# Patient Record
Sex: Male | Born: 1950 | Race: White | Hispanic: No | Marital: Married | Smoking: Never smoker
Health system: Southern US, Community
[De-identification: ages and names within clinical notes are randomized; demographics above are authoritative.]

## PROBLEM LIST (undated history)

## (undated) DIAGNOSIS — R972 Elevated prostate specific antigen [PSA]: Secondary | ICD-10-CM

## (undated) DIAGNOSIS — M1990 Unspecified osteoarthritis, unspecified site: Secondary | ICD-10-CM

## (undated) DIAGNOSIS — B029 Zoster without complications: Secondary | ICD-10-CM

## (undated) DIAGNOSIS — B019 Varicella without complication: Secondary | ICD-10-CM

## (undated) HISTORY — PX: COLONOSCOPY W/ POLYPECTOMY: SHX1380

## (undated) HISTORY — PX: NO PAST SURGERIES: SHX2092

## (undated) HISTORY — DX: Elevated prostate specific antigen (PSA): R97.20

## (undated) SURGERY — Surgical Case
Anesthesia: *Unknown

---

## 2005-04-19 ENCOUNTER — Ambulatory Visit: Payer: Self-pay | Admitting: Internal Medicine

## 2013-10-04 ENCOUNTER — Ambulatory Visit: Payer: Self-pay | Admitting: Internal Medicine

## 2015-10-27 ENCOUNTER — Encounter: Payer: Self-pay | Admitting: *Deleted

## 2015-10-28 ENCOUNTER — Encounter
Admission: RE | Disposition: A | Discharge status: Home / Self Care | Payer: Self-pay | Source: Ambulatory Visit | Attending: Unknown Physician Specialty

## 2015-10-28 ENCOUNTER — Ambulatory Visit: Payer: Medicare HMO | Admitting: Anesthesiology

## 2015-10-28 ENCOUNTER — Encounter: Payer: Self-pay | Admitting: Anesthesiology

## 2015-10-28 ENCOUNTER — Ambulatory Visit
Admission: RE | Admit: 2015-10-28 | Discharge: 2015-10-28 | Disposition: A | Discharge status: Home / Self Care | Payer: Medicare HMO | Source: Ambulatory Visit | Attending: Unknown Physician Specialty | Admitting: Unknown Physician Specialty

## 2015-10-28 DIAGNOSIS — Z8619 Personal history of other infectious and parasitic diseases: Secondary | ICD-10-CM | POA: Insufficient documentation

## 2015-10-28 DIAGNOSIS — Z7982 Long term (current) use of aspirin: Secondary | ICD-10-CM | POA: Diagnosis not present

## 2015-10-28 DIAGNOSIS — K640 First degree hemorrhoids: Secondary | ICD-10-CM | POA: Diagnosis not present

## 2015-10-28 DIAGNOSIS — Z79899 Other long term (current) drug therapy: Secondary | ICD-10-CM | POA: Diagnosis not present

## 2015-10-28 DIAGNOSIS — Z800 Family history of malignant neoplasm of digestive organs: Secondary | ICD-10-CM | POA: Diagnosis present

## 2015-10-28 DIAGNOSIS — M1990 Unspecified osteoarthritis, unspecified site: Secondary | ICD-10-CM | POA: Insufficient documentation

## 2015-10-28 DIAGNOSIS — K633 Ulcer of intestine: Secondary | ICD-10-CM | POA: Diagnosis not present

## 2015-10-28 DIAGNOSIS — Z9889 Other specified postprocedural states: Secondary | ICD-10-CM | POA: Insufficient documentation

## 2015-10-28 DIAGNOSIS — Z1211 Encounter for screening for malignant neoplasm of colon: Secondary | ICD-10-CM | POA: Diagnosis present

## 2015-10-28 DIAGNOSIS — D123 Benign neoplasm of transverse colon: Secondary | ICD-10-CM | POA: Insufficient documentation

## 2015-10-28 DIAGNOSIS — K5730 Diverticulosis of large intestine without perforation or abscess without bleeding: Secondary | ICD-10-CM | POA: Insufficient documentation

## 2015-10-28 HISTORY — DX: Varicella without complication: B01.9

## 2015-10-28 HISTORY — DX: Unspecified osteoarthritis, unspecified site: M19.90

## 2015-10-28 HISTORY — DX: Zoster without complications: B02.9

## 2015-10-28 HISTORY — PX: COLONOSCOPY WITH PROPOFOL: SHX5780

## 2015-10-28 SURGERY — COLONOSCOPY WITH PROPOFOL
Anesthesia: General

## 2015-10-28 MED ORDER — SODIUM CHLORIDE 0.9 % IV SOLN
INTRAVENOUS | Status: DC
  Administered 2015-10-28: 1000 mL via INTRAVENOUS

## 2015-10-28 MED ORDER — GLYCOPYRROLATE 0.2 MG/ML IJ SOLN
INTRAMUSCULAR | Status: DC | PRN
Start: 2015-10-28 — End: 2015-10-28
  Administered 2015-10-28: 0.2 mg via INTRAVENOUS

## 2015-10-28 MED ORDER — SODIUM CHLORIDE 0.9 % IV SOLN: INTRAVENOUS | Status: DC

## 2015-10-28 MED ORDER — PROPOFOL 10 MG/ML IV BOLUS
INTRAVENOUS | Status: DC | PRN
  Administered 2015-10-28: 20 mg via INTRAVENOUS
  Administered 2015-10-28: 60 mg via INTRAVENOUS
  Administered 2015-10-28: 20 mg via INTRAVENOUS

## 2015-10-28 MED ORDER — PROPOFOL 500 MG/50ML IV EMUL
INTRAVENOUS | Status: DC | PRN
  Administered 2015-10-28: 130 ug/kg/min via INTRAVENOUS

## 2015-10-28 MED ORDER — LIDOCAINE HCL (CARDIAC) 20 MG/ML IV SOLN
INTRAVENOUS | Status: DC | PRN
  Administered 2015-10-28: 60 mg via INTRAVENOUS

## 2015-10-28 MED ORDER — PHENYLEPHRINE HCL 10 MG/ML IJ SOLN
INTRAMUSCULAR | Status: DC | PRN
  Administered 2015-10-28 (×2): 50 ug via INTRAVENOUS

## 2015-10-28 NOTE — Anesthesia Postprocedure Evaluation (Signed)
Anesthesia Post Note  Patient: Benjamin Newton  Procedure(s) Performed: Procedure(s) (LRB): COLONOSCOPY WITH PROPOFOL (N/A)  Patient location during evaluation: Endoscopy Anesthesia Type: General Level of consciousness: awake and alert Pain management: pain level controlled Vital Signs Assessment: post-procedure vital signs reviewed and stable Respiratory status: spontaneous breathing, nonlabored ventilation, respiratory function stable and patient connected to nasal cannula oxygen Cardiovascular status: blood pressure returned to baseline and stable Postop Assessment: no signs of nausea or vomiting Anesthetic complications: no    Last Vitals:  Filed Vitals:   10/28/15 0949 10/28/15 0959  BP: 99/73 113/73  Pulse: 61 53  Temp:    Resp: 15 16    Last Pain: There were no vitals filed for this visit.               Precious Haws Piscitello

## 2015-10-28 NOTE — H&P (Signed)
   Primary Care Physician:  [place] Pink, MD Primary Gastroenterologist:  Dr. Vira Agar  Pre-Procedure History & Physical: HPI:  Benjamin Newton is a 65 y.o. male is here for an colonoscopy.   Past Medical History  Diagnosis Date  . Arthritis   . Chicken pox   . Shingles     Past Surgical History  Procedure Laterality Date  . Colonoscopy w/ polypectomy    . No past surgeries      Prior to Admission medications   Medication Sig Start Date End Date Taking? Authorizing Provider  aspirin 81 MG tablet Take 81 mg by mouth daily.   Yes Historical Provider, MD  etodolac (LODINE) 400 MG tablet Take 400 mg by mouth 2 (two) times daily.   Yes Historical Provider, MD  multivitamin-lutein (OCUVITE-LUTEIN) CAPS capsule Take 1 capsule by mouth daily.   Yes Historical Provider, MD  Omega-3 Fatty Acids (EQL OMEGA 3 FISH OIL) 1200 MG CAPS Take by mouth.   Yes Historical Provider, MD    Allergies as of 10/13/2015  . (Not on File)    History reviewed. No pertinent family history.  Social History   Social History  . Marital Status: Married    Spouse Name: N/A  . Number of Children: N/A  . Years of Education: N/A   Occupational History  . Not on file.   Social History Main Topics  . Smoking status: Never Smoker   . Smokeless tobacco: Never Used  . Alcohol Use: Yes  . Drug Use: No  . Sexual Activity: Not on file   Other Topics Concern  . Not on file   Social History Narrative    Review of Systems: See HPI, otherwise negative ROS  Physical Exam: BP 119/71 mmHg  Pulse 63  Temp(Src) 97.1 F (36.2 C) (Tympanic)  Resp 16  Ht 5\' 9"  (1.753 m)  Wt 76.204 kg (168 lb)  BMI 24.80 kg/m2  SpO2 100% General:   Alert,  pleasant and cooperative in NAD Head:  Normocephalic and atraumatic. Neck:  Supple; no masses or thyromegaly. Lungs:  Clear throughout to auscultation.    Heart:  Regular rate and rhythm. Abdomen:  Soft, nontender and nondistended. Normal bowel sounds, without  guarding, and without rebound.   Neurologic:  Alert and  oriented x4;  grossly normal neurologically.  Impression/Plan: Benjamin Newton is here for an colonoscopy to be performed for [facility]  Risks, benefits, limitations, and alternatives regarding  colonoscopy have been reviewed with the patient.  Questions have been answered.  All parties agreeable.   Gaylyn Cheers, MD  10/28/2015, 9:02 AM

## 2015-10-28 NOTE — Op Note (Signed)
[name] Procedure Date: 10/28/2015 9:05 AM MRN: [id number] Account #: [id number] Date of Birth: 17-Jul-1950 Admit Type: Outpatient Age: 65 Room: [facility] ENDO ROOM 4 Gender: Male Note Status: Finalized Procedure:            Colonoscopy Indications:          Screening in patient at increased risk: Family history                        of 1st-degree relative with colorectal cancer Providers:            Manya Silvas, MD Referring MD:         Irven Easterly. Kary Kos, MD (Referring MD) Medicines:            Propofol per Anesthesia Complications:        No immediate complications. Procedure:            Pre-Anesthesia Assessment:                       - After reviewing the risks and benefits, the patient                        was deemed in satisfactory condition to undergo the                        procedure.                       After obtaining informed consent, the colonoscope was                        passed under direct vision. Throughout the procedure,                        the patient's blood pressure, pulse, and oxygen                        saturations were monitored continuously. The                        Colonoscope was introduced through the anus and                        advanced to the the cecum, identified by appendiceal                        orifice and ileocecal valve. The colonoscopy was                        performed without difficulty. The patient tolerated the                        procedure well. The quality of the bowel preparation                        was excellent. Findings:      A diminutive polyp was found in the hepatic flexure. The polyp was       sessile. The polyp was removed with a jumbo cold forceps. Resection and       retrieval were complete.  A single (solitary) fourteen mm ulcer was found at the ileocecal valve.       No bleeding was present. No stigmata of recent bleeding  were seen.       Biopsies were taken with a cold forceps for histology.      Internal hemorrhoids were found during endoscopy. The hemorrhoids were       small and Grade I (internal hemorrhoids that do not prolapse).      Multiple small-mouthed diverticula were found in the sigmoid colon.      The exam was otherwise without abnormality. Impression:           - One diminutive polyp at the hepatic flexure, removed                        with a jumbo cold forceps. Resected and retrieved.                       - A single (solitary) ulcer at the ileocecal valve.                        Biopsied.                       - Internal hemorrhoids.                       - Diverticulosis in the sigmoid colon.                       - The examination was otherwise normal. Recommendation:       - Await pathology results. Manya Silvas, MD 10/28/2015 9:27:21 AM This report has been signed electronically. Number of Addenda: 0 Note Initiated On: 10/28/2015 9:05 AM Scope Withdrawal Time: 0 hours 10 minutes 26 seconds  Total Procedure Duration: 0 hours 15 minutes 54 seconds       [facility]

## 2015-10-28 NOTE — Transfer of Care (Signed)
Immediate Anesthesia Transfer of Care Note  Patient: Benjamin Newton  Procedure(s) Performed: Procedure(s): COLONOSCOPY WITH PROPOFOL (N/A)  Patient Location: PACU  Anesthesia Type:General  Level of Consciousness: awake, alert  and oriented  Airway & Oxygen Therapy: Patient Spontanous Breathing and Patient connected to nasal cannula oxygen  Post-op Assessment: Report given to RN and Post -op Vital signs reviewed and stable  Post vital signs: Reviewed and stable  Last Vitals:  Filed Vitals:   10/28/15 0844  BP: 119/71  Pulse: 63  Temp: 36.2 C  Resp: 16    Last Pain: There were no vitals filed for this visit.       Complications: No apparent anesthesia complications

## 2015-10-28 NOTE — Anesthesia Preprocedure Evaluation (Signed)
Anesthesia Evaluation  Patient identified by MRN, date of birth, ID band Patient awake    Reviewed: Allergy & Precautions, H&P , NPO status , Patient's Chart, lab work & pertinent test results  History of Anesthesia Complications Negative for: history of anesthetic complications  Airway Mallampati: III  TM Distance: <3 FB Neck ROM: limited    Dental  (+) Poor Dentition, Chipped   Pulmonary neg pulmonary ROS, neg shortness of breath,    Pulmonary exam normal breath sounds clear to auscultation       Cardiovascular Exercise Tolerance: Good (-) angina(-) DOE negative cardio ROS Normal cardiovascular exam Rhythm:regular Rate:Normal     Neuro/Psych negative neurological ROS  negative psych ROS   GI/Hepatic negative GI ROS, Neg liver ROS, neg GERD  ,  Endo/Other  negative endocrine ROS  Renal/GU negative Renal ROS  negative genitourinary   Musculoskeletal   Abdominal   Peds  Hematology negative hematology ROS (+)   Anesthesia Other Findings Past Medical History:   Arthritis                                                    Chicken pox                                                  Shingles                                                    Past Surgical History:   COLONOSCOPY W/ POLYPECTOMY                                    NO PAST SURGERIES                                             Signs and symptoms suggestive of sleep apnea    Reproductive/Obstetrics negative OB ROS                             Anesthesia Physical Anesthesia Plan  ASA: III  Anesthesia Plan: General   Post-op Pain Management:    Induction:   Airway Management Planned:   Additional Equipment:   Intra-op Plan:   Post-operative Plan:   Informed Consent: I have reviewed the patients History and Physical, chart, labs and discussed the procedure including the risks, benefits and alternatives for the  proposed anesthesia with the patient or authorized representative who has indicated his/her understanding and acceptance.   Dental Advisory Given  Plan Discussed with: Anesthesiologist, CRNA and Surgeon  Anesthesia Plan Comments:         Anesthesia Quick Evaluation

## 2015-10-29 LAB — SURGICAL PATHOLOGY

## 2015-10-30 ENCOUNTER — Encounter: Payer: Self-pay | Admitting: Unknown Physician Specialty

## 2020-03-18 ENCOUNTER — Other Ambulatory Visit: Payer: Self-pay | Admitting: Orthopedic Surgery

## 2020-03-18 DIAGNOSIS — M5441 Lumbago with sciatica, right side: Secondary | ICD-10-CM

## 2020-03-18 DIAGNOSIS — M5442 Lumbago with sciatica, left side: Secondary | ICD-10-CM

## 2020-03-18 DIAGNOSIS — M47816 Spondylosis without myelopathy or radiculopathy, lumbar region: Secondary | ICD-10-CM

## 2020-03-18 DIAGNOSIS — M4807 Spinal stenosis, lumbosacral region: Secondary | ICD-10-CM

## 2020-03-31 ENCOUNTER — Ambulatory Visit: Payer: Medicare HMO

## 2020-03-31 ENCOUNTER — Other Ambulatory Visit: Payer: Self-pay

## 2020-03-31 ENCOUNTER — Ambulatory Visit
Admission: RE | Admit: 2020-03-31 | Discharge: 2020-03-31 | Disposition: A | Discharge status: Home / Self Care | Payer: Medicare HMO | Source: Ambulatory Visit | Attending: Orthopedic Surgery | Admitting: Orthopedic Surgery

## 2020-03-31 DIAGNOSIS — M47816 Spondylosis without myelopathy or radiculopathy, lumbar region: Secondary | ICD-10-CM | POA: Diagnosis present

## 2020-03-31 DIAGNOSIS — M4807 Spinal stenosis, lumbosacral region: Secondary | ICD-10-CM

## 2020-03-31 DIAGNOSIS — M5442 Lumbago with sciatica, left side: Secondary | ICD-10-CM | POA: Diagnosis present

## 2020-03-31 DIAGNOSIS — M5441 Lumbago with sciatica, right side: Secondary | ICD-10-CM

## 2021-05-20 ENCOUNTER — Other Ambulatory Visit: Payer: Self-pay

## 2021-05-20 ENCOUNTER — Ambulatory Visit: Payer: Medicare HMO | Admitting: Dermatology

## 2021-05-20 DIAGNOSIS — L820 Inflamed seborrheic keratosis: Secondary | ICD-10-CM | POA: Diagnosis not present

## 2021-05-20 DIAGNOSIS — D229 Melanocytic nevi, unspecified: Secondary | ICD-10-CM

## 2021-05-20 DIAGNOSIS — L821 Other seborrheic keratosis: Secondary | ICD-10-CM

## 2021-05-20 NOTE — Progress Notes (Signed)
° °  New Patient Visit  Subjective  Benjamin Newton is a 71 y.o. male who presents for the following: New Patient (Initial Visit) (New patient here today for itchy areas at back and chest. He also reports a spot at right side of scalp he would like checked. He reports no history of skin cancer or family history of skin cancer. ). The patient has spots, moles and lesions to be evaluated, some may be new or changing and the patient has concerns that these could be cancer.  The following portions of the chart were reviewed this encounter and updated as appropriate:   Allergies   Meds   Problems   Med Hx   Surg Hx   Fam Hx      Review of Systems:  No other skin or systemic complaints except as noted in HPI or Assessment and Plan.  Objective  Well appearing patient in no apparent distress; mood and affect are within normal limits.  A focused examination was performed including upper extremities, including the arms, hands, fingers, and fingernails. Relevant physical exam findings are noted in the Assessment and Plan.  right chest x 1, right temple x 2, back x 15, left side infraxillary x 1 (19) Erythematous stuck-on, waxy papule or plaque   Assessment & Plan  Inflamed seborrheic keratosis (19) right chest x 1, right temple x 2, back x 15, left side infraxillary x 1  Destruction of lesion - right chest x 1, right temple x 2, back x 15, left side infraxillary x 1 Complexity: simple   Destruction method: cryotherapy   Informed consent: discussed and consent obtained   Timeout:  patient name, date of birth, surgical site, and procedure verified Lesion destroyed using liquid nitrogen: Yes   Region frozen until ice ball extended beyond lesion: Yes   Outcome: patient tolerated procedure well with no complications   Post-procedure details: wound care instructions given   Additional details:  Prior to procedure, discussed risks of blister formation, small wound, skin dyspigmentation, or rare scar  following cryotherapy. Recommend Vaseline ointment to treated areas while healing.  Seborrheic Keratoses - Stuck-on, waxy, tan-brown papules and/or plaques  - Benign-appearing - Discussed benign etiology and prognosis. - Observe - Call for any changes  Melanocytic Nevi - Tan-brown and/or pink-flesh-colored symmetric macules and papules - Benign appearing on exam today - Observation - Call clinic for new or changing moles - Recommend daily use of broad spectrum spf 30+ sunscreen to sun-exposed areas.   Return if symptoms worsen or fail to improve.  IRuthell Rummage, CMA, am acting as scribe for Sarina Ser, MD. Documentation: I have reviewed the above documentation for accuracy and completeness, and I agree with the above.  Sarina Ser, MD

## 2021-05-20 NOTE — Patient Instructions (Addendum)
Seborrheic Keratosis  What causes seborrheic keratoses? Seborrheic keratoses are harmless, common skin growths that first appear during adult life.  As time goes by, more growths appear.  Some people may develop a large number of them.  Seborrheic keratoses appear on both covered and uncovered body parts.  They are not caused by sunlight.  The tendency to develop seborrheic keratoses can be inherited.  They vary in color from skin-colored to gray, brown, or even black.  They can be either smooth or have a rough, warty surface.   Seborrheic keratoses are superficial and look as if they were stuck on the skin.  Under the microscope this type of keratosis looks like layers upon layers of skin.  That is why at times the top layer may seem to fall off, but the rest of the growth remains and re-grows.    Treatment Seborrheic keratoses do not need to be treated, but can easily be removed in the office.  Seborrheic keratoses often cause symptoms when they rub on clothing or jewelry.  Lesions can be in the way of shaving.  If they become inflamed, they can cause itching, soreness, or burning.  Removal of a seborrheic keratosis can be accomplished by freezing, burning, or surgery. If any spot bleeds, scabs, or grows rapidly, please return to have it checked, as these can be an indication of a skin cancer.  Cryotherapy Aftercare  Wash gently with soap and water everyday.   Apply Vaseline and Band-Aid daily until healed.     Melanoma ABCDEs  Melanoma is the most dangerous type of skin cancer, and is the leading cause of death from skin disease.  You are more likely to develop melanoma if you: Have light-colored skin, light-colored eyes, or red or blond hair Spend a lot of time in the sun Tan regularly, either outdoors or in a tanning bed Have had blistering sunburns, especially during childhood Have a close family member who has had a melanoma Have atypical moles or large birthmarks  Early detection  of melanoma is key since treatment is typically straightforward and cure rates are extremely high if we catch it early.   The first sign of melanoma is often a change in a mole or a new dark spot.  The ABCDE system is a way of remembering the signs of melanoma.  A for asymmetry:  The two halves do not match. B for border:  The edges of the growth are irregular. C for color:  A mixture of colors are present instead of an even brown color. D for diameter:  Melanomas are usually (but not always) greater than 39mm - the size of a pencil eraser. E for evolution:  The spot keeps changing in size, shape, and color.  Please check your skin once per month between visits. You can use a small mirror in front and a large mirror behind you to keep an eye on the back side or your body.   If you see any new or changing lesions before your next follow-up, please call to schedule a visit.  Please continue daily skin protection including broad spectrum sunscreen SPF 30+ to sun-exposed areas, reapplying every 2 hours as needed when you're outdoors.   Staying in the shade or wearing long sleeves, sun glasses (UVA+UVB protection) and wide brim hats (4-inch brim around the entire circumference of the hat) are also recommended for sun protection.    If You Need Anything After Your Visit  If you have any questions or concerns  for your doctor, please call our main line at [phone] and press option 4 to reach your doctor's medical assistant. If no one answers, please leave a voicemail as directed and we will return your call as soon as possible. Messages left after 4 pm will be answered the following business day.   You may also send [place] a message via [place]. We typically respond to MyChart messages within 1-2 business days.  For prescription refills, please ask your pharmacy to contact our office. Our fax number is [phone].  If you have an urgent issue when the clinic is closed that cannot wait until the  next business day, you can page your doctor at the number below.    Please note that while we do our best to be available for urgent issues outside of office hours, we are not available 24/7.   If you have an urgent issue and are unable to reach [place], you may choose to seek medical care at your doctor's office, retail clinic, urgent care center, or emergency room.  If you have a medical emergency, please immediately call 911 or go to the emergency department.  Pager Numbers  - Dr. Nehemiah Massed: [phone]  - Dr. Laurence Ferrari: [phone]  - Dr. Nicole Kindred: [phone]  In the event of inclement weather, please call our main line at [phone] for an update on the status of any delays or closures.  Dermatology Medication Tips: Please keep the boxes that topical medications come in in order to help keep track of the instructions about where and how to use these. Pharmacies typically print the medication instructions only on the boxes and not directly on the medication tubes.   If your medication is too expensive, please contact our office at [phone] option 4 or send [place] a message through [place].   We are unable to tell what your co-pay for medications will be in advance as this is different depending on your insurance coverage. However, we may be able to find a substitute medication at lower cost or fill out paperwork to get insurance to cover a needed medication.   If a prior authorization is required to get your medication covered by your insurance company, please allow [place] 1-2 business days to complete this process.  Drug prices often vary depending on where the prescription is filled and some pharmacies may offer cheaper prices.  The website www.goodrx.com contains coupons for medications through different pharmacies. The prices here do not account for what the cost may be with help from insurance (it may be cheaper with your insurance), but the website can give you the price if you did not  use any insurance.  - You can print the associated coupon and take it with your prescription to the pharmacy.  - You may also stop by our office during regular business hours and pick up a GoodRx coupon card.  - If you need your prescription sent electronically to a different pharmacy, notify our office through [facility] or by phone at [phone] option 4.     Si Usted Necesita Algo Despus de Su Visita  Tambin puede enviarnos un mensaje a travs de Pharmacist, community. Por lo general respondemos a los mensajes de MyChart en el transcurso de 1 a 2 das hbiles.  Para renovar recetas, por favor pida a su farmacia que se ponga en contacto con nuestra oficina. Harland Dingwall de fax es [place] [phone].  Si tiene un asunto urgente cuando la clnica est cerrada y que no puede esperar Nurse, children's  el siguiente da hbil, puede llamar/localizar a su doctor(a) al nmero que aparece a continuacin.   Por favor, tenga en cuenta que aunque hacemos todo lo posible para estar disponibles para asuntos urgentes fuera del horario de [place], no estamos disponibles las 24 horas del da, los 7 das de la [place].   Si tiene un problema urgente y no puede comunicarse con nosotros, puede optar por buscar atencin mdica  en el consultorio de su doctor(a), en una clnica privada, en un centro de atencin urgente o en una sala de emergencias.  Si tiene Engineering geologist, por favor llame inmediatamente al 911 o vaya a la sala de emergencias.  Nmeros de bper  - Dr. Nehemiah Massed: [phone]  - Dra. Moye: [phone]  - Dra. Nicole Kindred: [phone]  En caso de inclemencias del [place], por favor llame a Johnsie Kindred principal al [phone] para una actualizacin sobre el [place] de cualquier retraso o cierre.  Consejos para la medicacin en dermatologa: Por favor, guarde las cajas en las que vienen los medicamentos de uso tpico para ayudarle a seguir las instrucciones sobre dnde y cmo usarlos. Las farmacias  generalmente imprimen las instrucciones del medicamento slo en las cajas y no directamente en los tubos del [place].   Si su medicamento es muy caro, por favor, pngase en contacto con Zigmund Daniel llamando al [phone] y presione la opcin 4 o envenos un mensaje a travs de Pharmacist, community.   No podemos decirle cul ser su copago por los medicamentos por adelantado ya que esto es diferente dependiendo de la cobertura de su seguro. Sin embargo, es posible que podamos encontrar un medicamento sustituto a Electrical engineer un formulario para que el seguro cubra el medicamento que se considera necesario.   Si se requiere una autorizacin previa para que su compaa de seguros [place] su medicamento, por favor permtanos de 1 a 2 das hbiles para completar este proceso.  Los precios de los medicamentos varan con frecuencia dependiendo del Environmental consultant de dnde se surte la receta y alguna farmacias pueden ofrecer precios ms baratos.  El sitio web www.goodrx.com tiene cupones para medicamentos de Airline pilot. Los precios aqu no tienen en cuenta lo que podra costar con la ayuda del seguro (puede ser ms barato con su seguro), pero el sitio web puede darle el precio si no utiliz Research scientist (physical sciences).  - Puede imprimir el cupn correspondiente y llevarlo con su receta a la farmacia.  - Tambin puede pasar por nuestra oficina durante el horario de atencin regular y Charity fundraiser una tarjeta de cupones de GoodRx.  - Si necesita que su receta se enve electrnicamente a una farmacia diferente, informe a nuestra oficina a travs de MyChart de [place] o por telfono llamando al [phone] y presione la opcin 4.

## 2021-05-25 ENCOUNTER — Encounter: Payer: Self-pay | Admitting: Dermatology

## 2021-09-07 IMAGING — MR MR LUMBAR SPINE W/O CM
4 of 5 series · 26 of 48 positions shown · non-contrast
Comparison: None.

CLINICAL DATA: Chronic bilateral leg pain. Bilateral sciatica.
Spinal stenosis lumbar region.

EXAM:
MRI LUMBAR SPINE WITHOUT CONTRAST
TECHNIQUE: Multiplanar, multisequence MR imaging of the lumbar spine was
performed. No intravenous contrast was administered.

[Series 2: T2 · sagittal · 4.0mm · 0.81mm/px · 6 of 15 slices shown (1 of 2)]
[im 1/15]
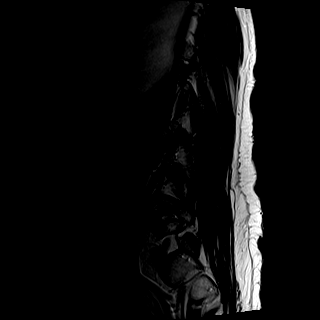
[im 3/15]
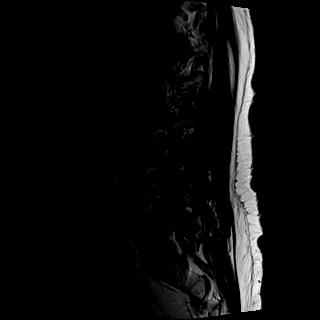
[im 6/15]
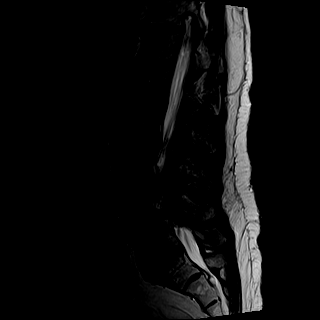
[im 9/15]
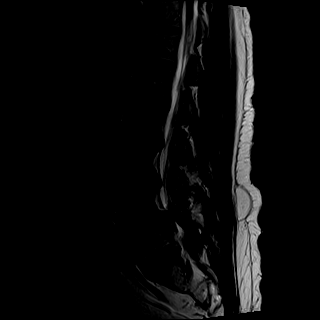
[im 12/15]
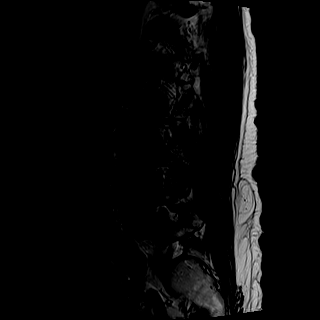
[im 15/15]
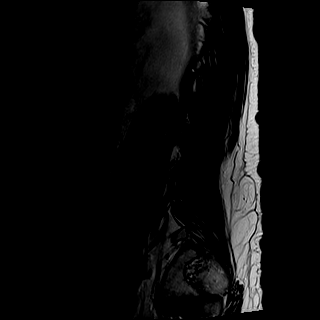

[Series 3: T1 · sagittal · 4.0mm · 0.41mm/px · 6 of 15 slices shown (1 of 2)]
[im 1/15]
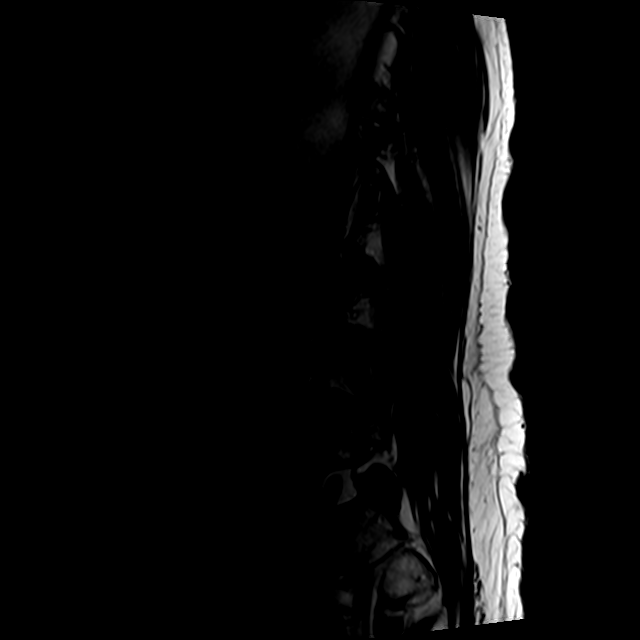
[im 3/15]
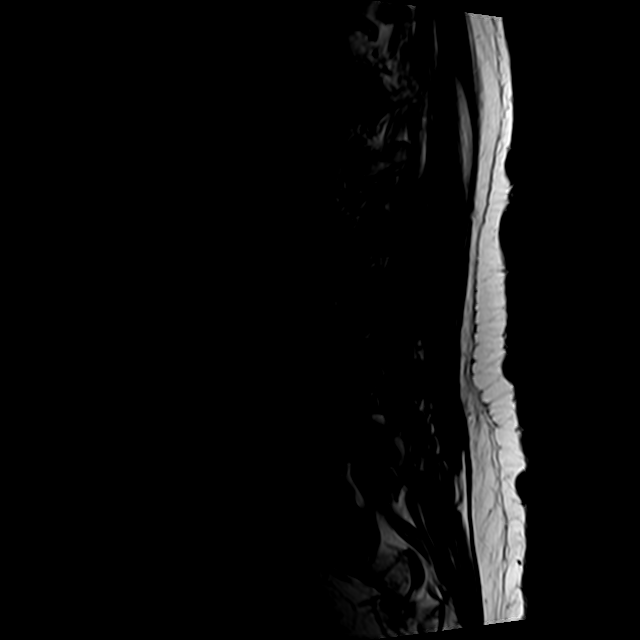
[im 6/15]
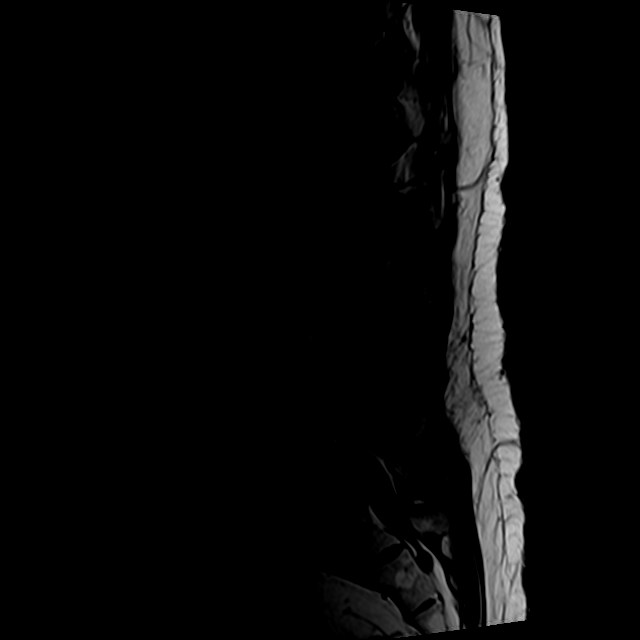
[im 9/15]
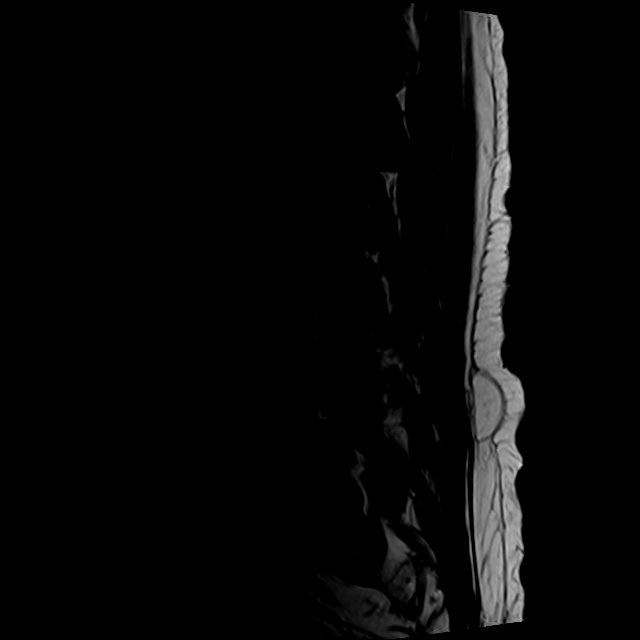
[im 12/15]
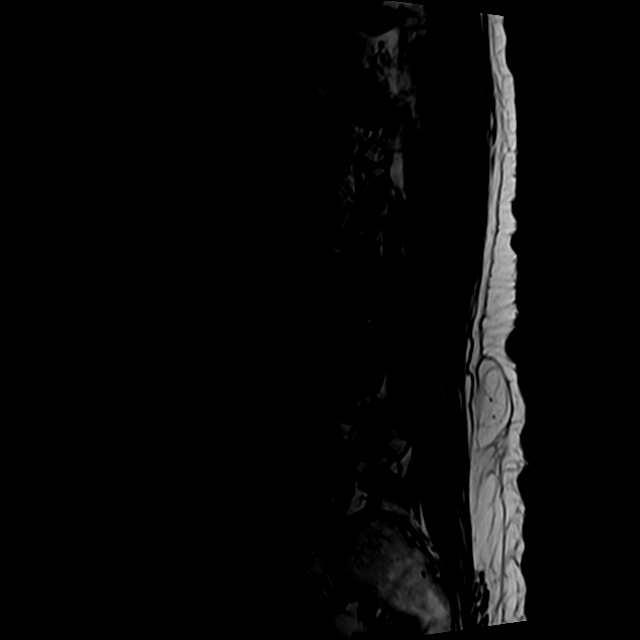
[im 15/15]
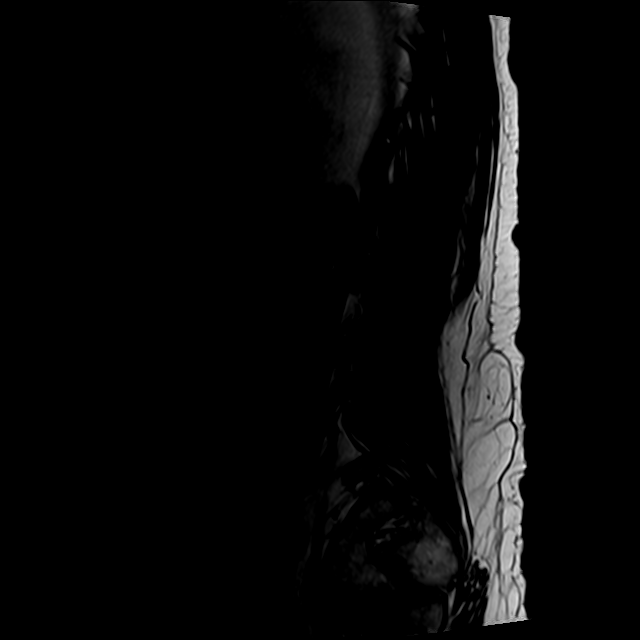

[Series 5: T2 · axial · 4.0mm · 0.78mm/px · z∈[-87,+130]mm · 9 of 41 slices shown (2 of 2)]
[im 1/41]
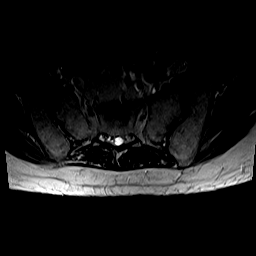
[im 6/41]
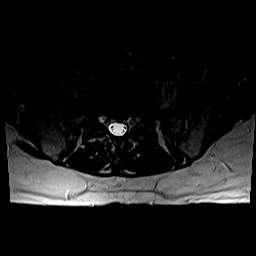
[im 12/41]
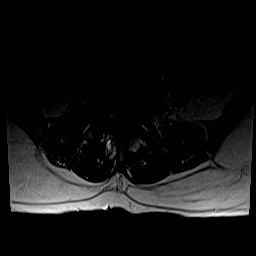
[im 18/41]
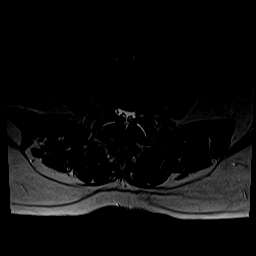
[im 21/41]
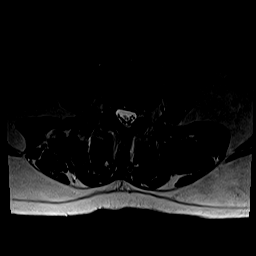
[im 23/41]
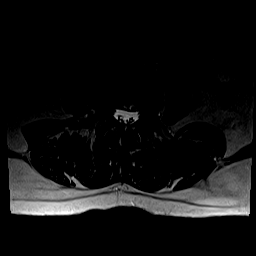
[im 29/41]
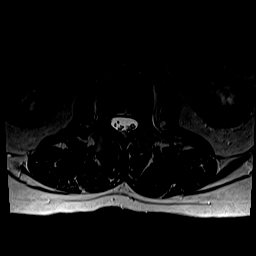
[im 35/41]
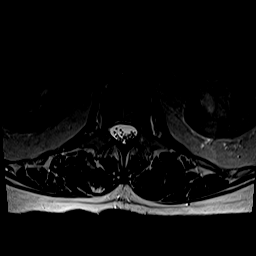
[im 41/41]
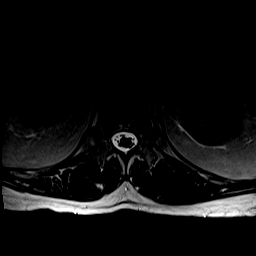

[Series 6: T1 · axial · 4.0mm · 0.39mm/px · z∈[-87,+100]mm · 5 of 41 slices shown (2 of 2)]
[im 1/41]
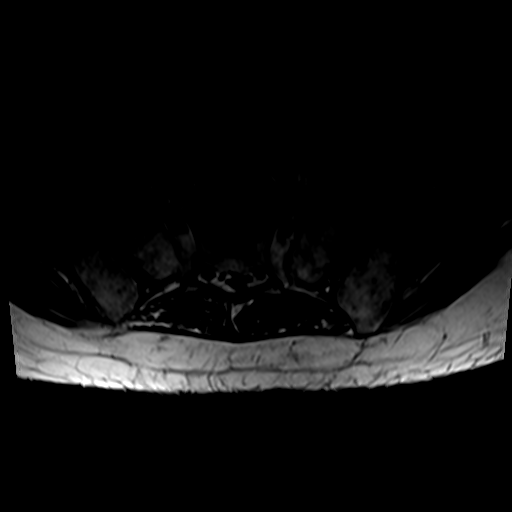
[im 6/41]
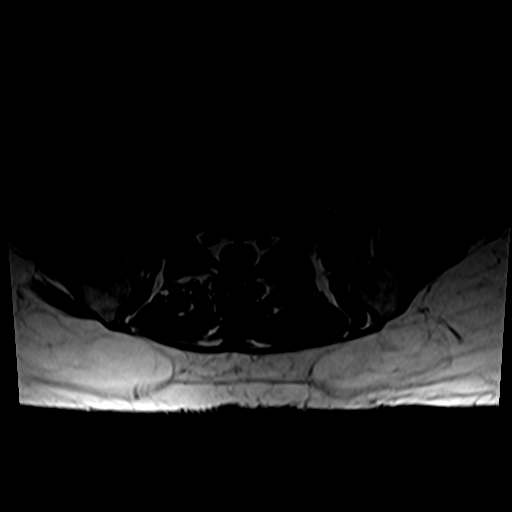
[im 12/41]
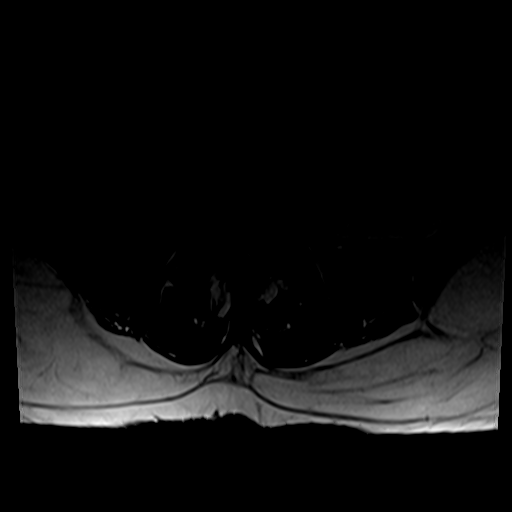
[im 21/41]
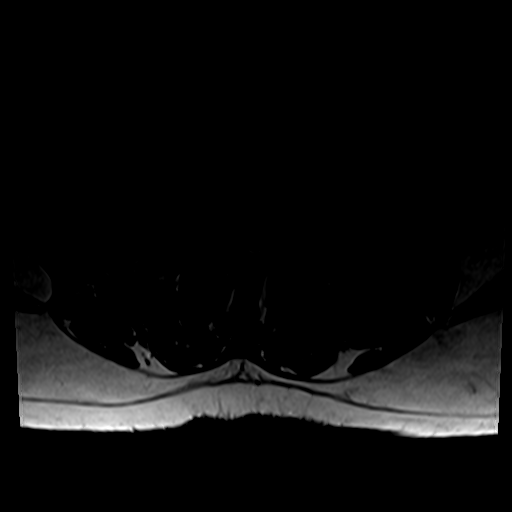
[im 35/41]
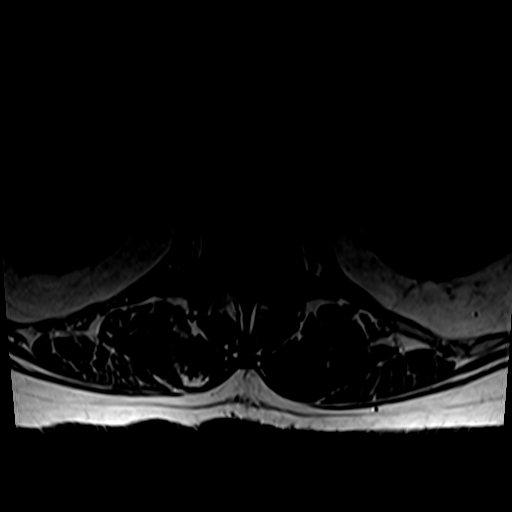

[26 of 48 positions shown; findings below may reference images not displayed]

FINDINGS: Segmentation:  Normal

Alignment:  Mild retrolisthesis L2-3.  10 mm anterolisthesis L4-5.

Vertebrae:  Normal bone marrow.  Negative for fracture or mass.

Conus medullaris and cauda equina: Conus extends to the L1-2 level.
Conus and cauda equina appear normal.

Paraspinal and other soft tissues: Negative for paraspinous mass or
adenopathy.

Disc levels:

T12-L1: Negative

L1-2: Negative

L2-3: Disc degeneration with disc bulging and mild endplate
spurring. Shallow central and left-sided disc protrusion. Mild facet
and ligamentum flavum hypertrophy. Mild spinal stenosis. Mild
subarticular stenosis bilaterally.

L3-4: Disc degeneration with diffuse disc bulging and mild endplate
spurring. Moderate facet and ligamentum flavum hypertrophy. Mild to
moderate spinal stenosis. Mild to moderate subarticular and
foraminal stenosis bilaterally.

L4-5: 10 mm anterolisthesis. Severe facet degeneration bilaterally
causing severe spinal stenosis and severe subarticular stenosis
bilaterally. Moderate right foraminal stenosis.

L5-S1: Negative
IMPRESSION: Mild to moderate spinal stenosis L3-4 with mild to moderate
subarticular and foraminal stenosis bilaterally

10 mm anterolisthesis L4-5 with severe spinal stenosis and severe
subarticular stenosis bilaterally.

## 2022-05-30 ENCOUNTER — Ambulatory Visit (INDEPENDENT_AMBULATORY_CARE_PROVIDER_SITE_OTHER): Payer: Medicare HMO | Admitting: Urology

## 2022-05-30 ENCOUNTER — Encounter: Payer: Self-pay | Admitting: Urology

## 2022-05-30 VITALS — BP 146/86 | HR 58 | Ht 68.0 in | Wt 175.0 lb

## 2022-05-30 DIAGNOSIS — R972 Elevated prostate specific antigen [PSA]: Secondary | ICD-10-CM

## 2022-05-30 NOTE — Progress Notes (Signed)
   05/30/22 4:30 PM   Mariea Clonts Bluett March 08, 1951 [id number]  CC: Elevated PSA  HPI: 72 year old healthy male referred for an elevated PSA of 4.96.  This had increased from 3.72 in September 2023, and 4.21 in June 2023, 3.1 in June 2022.  No prior prostate biopsies, denies any family history of prostate cancer.  He denies any gross hematuria or urinary symptoms.   PMH: Past Medical History:  Diagnosis Date   Arthritis    Chicken pox    Shingles     Surgical History: Past Surgical History:  Procedure Laterality Date   COLONOSCOPY W/ POLYPECTOMY     COLONOSCOPY WITH PROPOFOL N/A 10/28/2015   Procedure: COLONOSCOPY WITH PROPOFOL;  Surgeon: Manya Silvas, MD;  Location: [facility];  Service: Endoscopy;  Laterality: N/A;   NO PAST SURGERIES     Family History: No family history on file.  Social History:  reports that he has never smoked. He has never been exposed to tobacco smoke. He has never used smokeless tobacco. He reports current alcohol use. He reports that he does not use drugs.  Physical Exam: BP (!) 146/86   Pulse (!) 58   Ht '5\' 8"'$  (1.727 m)   Wt 175 lb (79.4 kg)   BMI 26.61 kg/m    Constitutional:  Alert and oriented, No acute distress. Cardiovascular: No clubbing, cyanosis, or edema. Respiratory: Normal respiratory effort, no increased work of breathing. GI: Abdomen is soft, nontender, nondistended, no abdominal masses DRE: 30 g, smooth, no nodules or masses.  Assessment & Plan:   72 year old male with very mildly elevated PSA of 4.96 that has been relatively stable over the last few years, but subtly increased.  DRE benign today.  We reviewed the implications of an elevated PSA and the uncertainty surrounding it. In general, a man's PSA increases with age and is produced by both normal and cancerous prostate tissue. The differential diagnosis for elevated PSA includes BPH, prostate cancer, infection, recent intercourse/ejaculation, recent urethroscopic  manipulation (foley placement/cystoscopy) or trauma, and prostatitis.   Management of an elevated PSA can include observation or prostate biopsy and we discussed this in detail. Our goal is to detect clinically significant prostate cancers, and manage with either active surveillance, surgery, or radiation for localized disease. Risks of prostate biopsy include bleeding, infection (including life threatening sepsis), pain, and lower urinary symptoms. Hematuria, hematospermia, and blood in the stool are all common after biopsy and can persist up to 4 weeks.   We reviewed his PSA trend has been relatively stable over the last few years, and that PSA screening is not routinely recommended in men over age 50, however with his good health I think further evaluation is very reasonable.  I recommended a repeat PSA with reflex to free in 3 months, and if remains elevated consider prostate biopsy, however if stable and elevated percentage free is reassuring could consider observation alone   Nickolas Madrid, MD 05/30/2022  [facility] [address], [place], [place] [address] [phone]

## 2022-05-30 NOTE — Patient Instructions (Signed)
Prostate Cancer Screening  Prostate cancer screening is testing that is done to check for the presence of prostate cancer in men. The prostate gland is a walnut-sized gland that is located below the bladder and in front of the rectum in males. The function of the prostate is to add fluid to semen during ejaculation. Prostate cancer is one of the most common types of cancer in men. Who should have prostate cancer screening? Screening recommendations vary based on age and other risk factors, as well as between the professional organizations who make the recommendations. In general, screening is recommended if: You are age 50 to 70 and have an average risk for prostate cancer. You should talk with your health care provider about your need for screening and how often screening should be done. Because most prostate cancers are slow growing and will not cause death, screening in this age group is generally reserved for men who have a 10- to 15-year life expectancy. You are younger than age 50, and you have these risk factors: Having a father, brother, or uncle who has been diagnosed with prostate cancer. The risk is higher if your family member's cancer occurred at an early age or if you have multiple family members with prostate cancer at an early age. Being a male who is Black or is of Caribbean or sub-Saharan African descent. In general, screening is not recommended if: You are younger than age 40. You are between the ages of 40 and 49 and you have no risk factors. You are 70 years of age or older. At this age, the risks that screening can cause are greater than the benefits that it may provide. If you are at high risk for prostate cancer, your health care provider may recommend that you have screenings more often or that you start screening at a younger age. How is screening for prostate cancer done? The recommended prostate cancer screening test is a blood test called the prostate-specific antigen  (PSA) test. PSA is a protein that is made in the prostate. As you age, your prostate naturally produces more PSA. Abnormally high PSA levels may be caused by: Prostate cancer. An enlarged prostate that is not caused by cancer (benign prostatic hyperplasia, or BPH). This condition is very common in older men. A prostate gland infection (prostatitis) or urinary tract infection. Certain medicines such as male hormones (like testosterone) or other medicines that raise testosterone levels. A rectal exam may be done as part of prostate cancer screening to help provide information about the size of your prostate gland. When a rectal exam is performed, it should be done after the PSA level is drawn to avoid any effect on the results. Depending on the PSA results, you may need more tests, such as: A physical exam to check the size of your prostate gland, if not done as part of screening. Blood and imaging tests. A procedure to remove tissue samples from your prostate gland for testing (biopsy). This is the only way to know for certain if you have prostate cancer. What are the benefits of prostate cancer screening? Screening can help to identify cancer at an early stage, before symptoms start and when the cancer can be treated more easily. There is a small chance that screening may lower your risk of dying from prostate cancer. The chance is small because prostate cancer is a slow-growing cancer, and most men with prostate cancer die from a different cause. What are the risks of prostate cancer screening? The   main risk of prostate cancer screening is diagnosing and treating prostate cancer that would never have caused any symptoms or problems. This is called overdiagnosisand overtreatment. PSA screening cannot tell you if your PSA is high due to cancer or a different cause. A prostate biopsy is the only procedure to diagnose prostate cancer. Even the results of a biopsy may not tell you if your cancer needs to  be treated. Slow-growing prostate cancer may not need any treatment other than monitoring, so diagnosing and treating it may cause unnecessary stress or other side effects. Questions to ask your health care provider When should I start prostate cancer screening? What is my risk for prostate cancer? How often do I need screening? What type of screening tests do I need? How do I get my test results? What do my results mean? Do I need treatment? Where to find more information The American Cancer Society: www.cancer.org American Urological Association: www.auanet.org Contact a health care provider if: You have difficulty urinating. You have pain when you urinate or ejaculate. You have blood in your urine or semen. You have pain in your back or in the area of your prostate. Summary Prostate cancer is a common type of cancer in men. The prostate gland is located below the bladder and in front of the rectum. This gland adds fluid to semen during ejaculation. Prostate cancer screening may identify cancer at an early stage, when the cancer can be treated more easily and is less likely to have spread to other areas of the body. The prostate-specific antigen (PSA) test is the recommended screening test for prostate cancer, but it has associated risks. Discuss the risks and benefits of prostate cancer screening with your health care provider. If you are age 26 or older, the risks that screening can cause are greater than the benefits that it may provide. This information is not intended to replace advice given to you by your health care provider. Make sure you discuss any questions you have with your health care provider. Document Revised: 10/26/2020 Document Reviewed: 10/26/2020 Elsevier Patient Education  [facility] Antigen Test Why am I having this test? The prostate-specific antigen (PSA) test is a screening test for prostate cancer. It can identify early signs of  prostate cancer, which may allow for early detection and more effective treatment. Your health care provider may recommend that you have a PSA test starting at age 7 or that you have one earlier if you are at higher risk for prostate cancer. You may also have a PSA test: To monitor treatment of prostate cancer. To check whether prostate cancer has returned after treatment. What is being tested? This test measures the amount of PSA in your blood. PSA is a protein that is made in the prostate. The prostate naturally produces more PSA as you age, but very high levels may be a sign of a medical condition. What kind of sample is taken?  A blood sample is required for this test. It is usually collected by inserting a needle into a blood vessel but can also be collected by sticking a finger with a small needle. Blood for this test should be drawn before having an exam of the prostate that involves digital rectal examination to avoid affecting the results. How do I prepare for this test? Do not ejaculate starting 24 hours before your test, or as long as told by your health care provider, as this can cause an elevation in PSA. Do not undergo  any procedures that require manipulation of the prostate, such as biopsy or surgery, for 6 weeks before the test is done as this can cause an elevation in PSA. Tell a health care provider about: Any signs you may have of other conditions that can affect PSA levels, such as: An enlarged prostate that is not caused by cancer (benign prostatic hyperplasia, or BPH). This condition is very common in older men. A prostate or urinary tract infection. Any allergies you have. All medicines you are taking, including vitamins, herbs, eye drops, creams, and over-the-counter medicines. This also includes: Medicines to assist with hair growth, such as finasteride. Any recent exposure to a medicine called diethylstilbestrol (DES). Medicines such as male hormones (like testosterone)  or other medicines that raise testosterone levels. Any bleeding problems you have. Any recent procedures you have had, especially any procedures involving the prostate or rectum. Any medical conditions you have. How are the results reported? Your test results will be reported as a value that indicates how much PSA is in your blood. This will be given as nanograms of PSA per milliliter of blood (ng/mL). Your health care provider will compare your results to normal ranges that were established after testing a large group of people (reference ranges). Reference ranges may vary among labs and hospitals. PSA levels vary from person to person and generally increase with age. Because of this variation, there is no single PSA value that is considered normal for everyone. Instead, PSA reference ranges are used to describe whether your PSA levels are considered low or high (elevated). Common reference ranges are: Low: 0-2.5 ng/mL. Slightly to moderately elevated: 2.6-10.0 ng/mL. Moderately elevated: 10.0-19.9 ng/mL. Significantly elevated: 20 ng/mL or greater. What do the results mean? A test result that is higher than 4 ng/mL may mean that you have prostate cancer. However, a PSA test by itself is not enough to diagnose prostate cancer. High PSA levels may also be caused by the natural aging process, prostate infection (prostatitis), or BPH. PSA screening cannot tell you if your PSA is high due to cancer or a different cause. A prostate biopsy is the only way to diagnose prostate cancer. A risk of having the PSA test is diagnosing and treating prostate cancer that would never have caused any symptoms or problems (overdiagnosis and overtreatment). Talk with your health care provider about what your results mean. In some cases, your health care provider may do more testing to confirm the results. Questions to ask your health care provider Ask your health care provider, or the department that is doing the  test: When will my results be ready? How will I get my results? What are my treatment options? What other tests do I need? What are my next steps? Summary The prostate-specific antigen (PSA) test is a screening test for prostate cancer. Your health care provider may recommend that you have a PSA test starting at age 47 or that you have one earlier if you are at higher risk for prostate cancer. A test result that is higher than 4 ng/mL may mean that you have prostate cancer. However, elevated levels can be caused by a number of conditions other than prostate cancer. Talk with your health care provider about what your results mean. This information is not intended to replace advice given to you by your health care provider. Make sure you discuss any questions you have with your health care provider. Document Revised: 09/09/2020 Document Reviewed: 09/09/2020 Elsevier Patient Education  [place].

## 2022-09-26 ENCOUNTER — Other Ambulatory Visit: Payer: Medicare HMO

## 2022-09-26 DIAGNOSIS — R972 Elevated prostate specific antigen [PSA]: Secondary | ICD-10-CM

## 2022-09-27 LAB — PSA TOTAL (REFLEX TO FREE): Prostate Specific Ag, Serum: 4.2 ng/mL — ABNORMAL HIGH (ref 0.0–4.0)

## 2022-09-27 LAB — FPSA% REFLEX
% FREE PSA: 12.9 %
PSA, FREE: 0.54 ng/mL

## 2022-09-29 ENCOUNTER — Ambulatory Visit: Payer: Medicare HMO | Admitting: Urology

## 2022-09-29 ENCOUNTER — Encounter: Payer: Self-pay | Admitting: Urology

## 2022-09-29 VITALS — BP 116/65 | HR 62 | Ht 68.0 in | Wt 175.0 lb

## 2022-09-29 DIAGNOSIS — R972 Elevated prostate specific antigen [PSA]: Secondary | ICD-10-CM | POA: Diagnosis not present

## 2022-09-29 NOTE — Patient Instructions (Signed)

## 2022-09-29 NOTE — Progress Notes (Signed)
   09/29/2022 1:36 PM   Benjamin Newton 1950/12/13 [id number]  Reason for visit: Follow up elevated PSA  HPI: Healthy 72 year old male who I saw originally in January 2024 for an elevated PSA of 4.96.  This had increased from 3.7 in September 2023, and 4.2 in June 2023, 3.1 in June 2022.  No urinary symptoms, DRE was benign at our original visit.  Repeat PSA decreased to 4.2(13% free) which has essentially been stable over the last 2 years.  We discussed options including repeat PSA in 1 year, prostate MRI, or prostate biopsy.  Risk and benefits discussed extensively, as well as the AUA guidelines that do not recommend routine screening in men over age 63.  Using shared decision making he opted for repeat PSA in 1 year  RTC 1 year PSA reflex to free prior  Sondra Come, MD  [facility] [address] [place], [place] [address] [phone]

## 2022-12-26 ENCOUNTER — Other Ambulatory Visit: Payer: Self-pay | Admitting: Podiatry

## 2022-12-29 NOTE — Discharge Instructions (Addendum)
[facility]  POST OPERATIVE INSTRUCTIONS FOR DR. Ether Griffins AND DR. Vona Whiters [facility] PODIATRY DEPARTMENT   Take your medication as prescribed.  Pain medication should be taken only as needed.  May use ibuprofen or tylenol in addition for pain relief as well.  Keep the dressing clean, dry and intact.  Keep your foot elevated above the heart level for the first 48 hours.  Continue elevation thereafter to improve swelling.  May also apply ice to the top of the right foot for maximum 10 minutes out of every 1 hour as needed.  Walking to the bathroom and brief periods of walking are acceptable, unless we have instructed you to be non-weight bearing.  Always wear your post-op shoe when walking.  Always use your crutches if you are to be non-weight bearing.  Do not take a shower. Baths are permissible as long as the foot is kept out of the water.   Every hour you are awake:  Bend your knee 15 times. Flex foot 15 times Massage calf 15 times  Call [facility] [phone]) if any of the following problems occur: You develop a temperature or fever. The bandage becomes saturated with blood. Medication does not stop your pain. Injury of the foot occurs. Any symptoms of infection including redness, odor, or red streaks running from wound.

## 2022-12-29 NOTE — Anesthesia Preprocedure Evaluation (Addendum)
Anesthesia Evaluation  Patient identified by MRN, date of birth, ID band Patient awake    Reviewed: Allergy & Precautions, H&P , NPO status , Patient's Chart, lab work & pertinent test results  Airway Mallampati: I  TM Distance: >3 FB Neck ROM: Full    Dental  (+) Caps Multiple caps:   Pulmonary neg pulmonary ROS   Pulmonary exam normal breath sounds clear to auscultation       Cardiovascular negative cardio ROS Normal cardiovascular exam Rhythm:Regular Rate:Normal     Neuro/Psych negative neurological ROS  negative psych ROS   GI/Hepatic negative GI ROS, Neg liver ROS,,,  Endo/Other  negative endocrine ROS    Renal/GU negative Renal ROS  negative genitourinary   Musculoskeletal negative musculoskeletal ROS (+) Arthritis ,    Abdominal   Peds negative pediatric ROS (+)  Hematology negative hematology ROS (+)   Anesthesia Other Findings Hx shingles arthritis  Reproductive/Obstetrics negative OB ROS                             Anesthesia Physical Anesthesia Plan  ASA: 2  Anesthesia Plan: General   Post-op Pain Management:    Induction: Intravenous  PONV Risk Score and Plan:   Airway Management Planned: LMA  Additional Equipment:   Intra-op Plan:   Post-operative Plan: Extubation in OR  Informed Consent: I have reviewed the patients History and Physical, chart, labs and discussed the procedure including the risks, benefits and alternatives for the proposed anesthesia with the patient or authorized representative who has indicated his/her understanding and acceptance.     Dental Advisory Given  Plan Discussed with: Anesthesiologist, CRNA and Surgeon  Anesthesia Plan Comments: (Patient consented for risks of anesthesia including but not limited to:  - adverse reactions to medications - damage to eyes, teeth, lips or other oral mucosa - nerve damage due to positioning   - sore throat or hoarseness - Damage to heart, brain, nerves, lungs, other parts of body or loss of life  Patient voiced understanding.)       Anesthesia Quick Evaluation

## 2022-12-30 ENCOUNTER — Encounter: Payer: Self-pay | Admitting: Podiatry

## 2023-01-05 ENCOUNTER — Other Ambulatory Visit: Payer: Self-pay

## 2023-01-05 ENCOUNTER — Encounter: Payer: Self-pay | Admitting: Podiatry

## 2023-01-05 ENCOUNTER — Encounter
Admission: RE | Disposition: A | Discharge status: Home / Self Care | Payer: Self-pay | Source: Home / Self Care | Attending: Podiatry

## 2023-01-05 ENCOUNTER — Ambulatory Visit: Payer: Medicare HMO | Admitting: Anesthesiology

## 2023-01-05 ENCOUNTER — Ambulatory Visit
Admission: RE | Admit: 2023-01-05 | Discharge: 2023-01-05 | Disposition: A | Discharge status: Home / Self Care | Payer: Medicare HMO | Attending: Podiatry | Admitting: Podiatry

## 2023-01-05 DIAGNOSIS — M1990 Unspecified osteoarthritis, unspecified site: Secondary | ICD-10-CM | POA: Diagnosis not present

## 2023-01-05 DIAGNOSIS — M2041 Other hammer toe(s) (acquired), right foot: Secondary | ICD-10-CM | POA: Insufficient documentation

## 2023-01-05 DIAGNOSIS — M2011 Hallux valgus (acquired), right foot: Secondary | ICD-10-CM | POA: Diagnosis not present

## 2023-01-05 HISTORY — PX: AMPUTATION TOE: SHX6595

## 2023-01-05 SURGERY — AMPUTATION, TOE
Anesthesia: General | Site: Toe | Laterality: Right

## 2023-01-05 MED ORDER — CEFAZOLIN SODIUM-DEXTROSE 2-4 GM/100ML-% IV SOLN: 2.0000 g | INTRAVENOUS | Status: DC

## 2023-01-05 MED ORDER — HYDROCODONE-ACETAMINOPHEN 5-325 MG PO TABS: 1.0000 | ORAL_TABLET | Freq: Four times a day (QID) | ORAL | 0 refills | Status: AC | PRN

## 2023-01-05 MED ORDER — FENTANYL CITRATE (PF) 100 MCG/2ML IJ SOLN
INTRAMUSCULAR | Status: DC | PRN
  Administered 2023-01-05: 50 ug via INTRAVENOUS

## 2023-01-05 MED ORDER — DEXAMETHASONE SODIUM PHOSPHATE 10 MG/ML IJ SOLN
INTRAMUSCULAR | Status: DC | PRN
  Administered 2023-01-05: 4 mg via INTRAVENOUS

## 2023-01-05 MED ORDER — LACTATED RINGERS IV SOLN: INTRAVENOUS | Status: DC

## 2023-01-05 MED ORDER — KETOROLAC TROMETHAMINE 30 MG/ML IJ SOLN
INTRAMUSCULAR | Status: DC | PRN
  Administered 2023-01-05: 15 mg via INTRAVENOUS

## 2023-01-05 MED ORDER — PROPOFOL 10 MG/ML IV BOLUS
INTRAVENOUS | Status: DC | PRN
Start: 2023-01-05 — End: 2023-01-05
  Administered 2023-01-05: 200 mg via INTRAVENOUS

## 2023-01-05 MED ORDER — EPHEDRINE SULFATE (PRESSORS) 50 MG/ML IJ SOLN
INTRAMUSCULAR | Status: DC | PRN
  Administered 2023-01-05 (×2): 10 mg via INTRAVENOUS
  Administered 2023-01-05: 5 mg via INTRAVENOUS
  Administered 2023-01-05: 10 mg via INTRAVENOUS
  Administered 2023-01-05: 5 mg via INTRAVENOUS

## 2023-01-05 MED ORDER — LIDOCAINE HCL (CARDIAC) PF 100 MG/5ML IV SOSY
PREFILLED_SYRINGE | INTRAVENOUS | Status: DC | PRN
  Administered 2023-01-05: 80 mg via INTRAVENOUS

## 2023-01-05 MED ORDER — 0.9 % SODIUM CHLORIDE (POUR BTL) OPTIME
TOPICAL | Status: DC | PRN
  Administered 2023-01-05: 1000 mL

## 2023-01-05 MED ORDER — BUPIVACAINE HCL 0.5 % IJ SOLN
INTRAMUSCULAR | Status: DC | PRN
  Administered 2023-01-05 (×2): 10 mL

## 2023-01-05 MED ORDER — ACETAMINOPHEN 10 MG/ML IV SOLN
INTRAVENOUS | Status: DC | PRN
  Administered 2023-01-05: 1000 mg via INTRAVENOUS

## 2023-01-05 SURGICAL SUPPLY — 29 items
BNDG CMPR 75X21 PLY HI ABS (MISCELLANEOUS) ×1
BNDG ELASTIC 4X5.8 VLCR STR LF (GAUZE/BANDAGES/DRESSINGS) ×1 IMPLANT
BNDG ESMARK 4X12 TAN STRL LF (GAUZE/BANDAGES/DRESSINGS) ×1 IMPLANT
BNDG GAUZE DERMACEA FLUFF 4 (GAUZE/BANDAGES/DRESSINGS) ×1 IMPLANT
BNDG GZE DERMACEA 4 6PLY (GAUZE/BANDAGES/DRESSINGS) ×1
CANISTER SUCT 1200ML W/VALVE (MISCELLANEOUS) ×1 IMPLANT
CUFF TOURN SGL QUICK 18X4 (TOURNIQUET CUFF) IMPLANT
DURAPREP 26ML APPLICATOR (WOUND CARE) ×1 IMPLANT
ELECT REM PT RETURN 9FT ADLT (ELECTROSURGICAL) ×1
ELECTRODE REM PT RTRN 9FT ADLT (ELECTROSURGICAL) ×1 IMPLANT
GAUZE SPONGE 4X4 12PLY STRL (GAUZE/BANDAGES/DRESSINGS) ×1 IMPLANT
GAUZE STRETCH 2X75IN STRL (MISCELLANEOUS) ×1 IMPLANT
GAUZE XEROFORM 1X8 LF (GAUZE/BANDAGES/DRESSINGS) ×1 IMPLANT
GLOVE BIOGEL PI IND STRL 7.5 (GLOVE) ×1 IMPLANT
GLOVE SURG SS PI 7.0 STRL IVOR (GLOVE) ×1 IMPLANT
GOWN STRL REUS W/ TWL LRG LVL3 (GOWN DISPOSABLE) ×2 IMPLANT
GOWN STRL REUS W/TWL LRG LVL3 (GOWN DISPOSABLE) ×2
KIT TURNOVER KIT A (KITS) ×1 IMPLANT
NDL HYPO 25GX1X1/2 BEV (NEEDLE) ×1 IMPLANT
NEEDLE HYPO 25GX1X1/2 BEV (NEEDLE) ×1
PACK EXTREMITY (MISCELLANEOUS) ×1 IMPLANT
SOL PREP PVP 2OZ (MISCELLANEOUS) ×1
SOLUTION PREP PVP 2OZ (MISCELLANEOUS) ×1 IMPLANT
STOCKINETTE STRL 6IN 960660 (GAUZE/BANDAGES/DRESSINGS) ×1 IMPLANT
SUT ETHILON 3-0 FS-10 30 BLK (SUTURE) ×1
SUT VIC AB 3-0 SH 27 (SUTURE) ×1
SUT VIC AB 3-0 SH 27X BRD (SUTURE) IMPLANT
SUTURE EHLN 3-0 FS-10 30 BLK (SUTURE) IMPLANT
SYR 10ML LL (SYRINGE) ×1 IMPLANT

## 2023-01-05 NOTE — Op Note (Signed)
PODIATRY / FOOT AND ANKLE SURGERY OPERATIVE REPORT    SURGEON: Rosetta Posner, DPM  PRE-OPERATIVE DIAGNOSIS:  1.  Right hallux valgus 2.  Right second hammertoe contracture  POST-OPERATIVE DIAGNOSIS: Same  PROCEDURE(S): Right second toe amputation  HEMOSTASIS: Ankle tourniquet  ANESTHESIA: MAC  ESTIMATED BLOOD LOSS: 5 cc  FINDING(S): 1.  Right second hammertoe contracture with dorsal dislocation over the top of the second metatarsal phalangeal joint  PATHOLOGY/SPECIMEN(S): Right second toe  INDICATIONS:   Benjamin Newton is a 72 y.o. male who presents with a painful callus to the dorsal aspect of the right second toe as the toe rubs against the shoe.  Patient has hammertoe contracture as well as bunion deformity which the big toes going underneath the second toe and the third toes drifting medially and starting underneath the second toe as well.  The second toe is elevated and hits in his shoe and causes pain discomfort.  All treatment options were discussed with the patient both conservative and surgical attempts at correction including potential risks and complications.  Discussed reconstruction versus amputation.  At this time patient elects for amputation of right second toe.  Consent obtained.  No guarantees given.  DESCRIPTION: After obtaining full informed written consent, the patient was brought back to the operating room and placed supine upon the operating table.  The patient received IV antibiotics prior to induction.  After obtaining adequate anesthesia, the patient was prepped and draped in the standard fashion.  10 cc of half percent Marcaine was injected about the right second ray.  An Esmarch bandage was used to exsanguinate the right foot and the pneumatic ankle tourniquet was inflated.  Attention was directed to the right second toe where a racquet type incision was made around the toe distal to the second metatarsal phalangeal joint area with arms extending plantarly  and dorsally.  The incision was made straight to bone.  An extensor tenotomy was performed at the second metatarsal phalange joint level followed by capsulotomy and release the collateral and suspensory ligaments as well as any connection to the flexor tendon and plantar plate.  The second digit was disarticulated and passed off the operative site and sent off to pathology as right second toe.  The residual extensor and flexor tendons were cut as far proximally as possible.  The surgical site was flushed with copious amounts normal sterile saline.  The deep subcutaneous tissue was reapproximated well coapted with 4-0 Vicryl.  The skin was then reapproximated well coapted with 3-0 nylon.  An additional 10 cc of half percent Marcaine plain was injected about the operative area.  A postoperative dressing was then applied consisting of Xeroform followed by 4 x 4 gauze, gauze roll, Ace wrap.  The pneumatic ankle tourniquet was deflated and a prompt hyperemic response was noted all digits of the right foot.  The patient tolerated the procedure and anesthesia well and was transferred to recovery in vital signs stable vascular status intact to all toes the right foot.  Following.  Postoperative monitoring the patient be discharged home with the appropriate orders, instructions, and medications.  Patient to follow-up in outpatient clinic next week for dressing change and may remain partial weightbearing with heel contact for short distances.  COMPLICATIONS: None  CONDITION: Good, stable  Rosetta Posner, DPM

## 2023-01-05 NOTE — H&P (Signed)
HISTORY AND PHYSICAL INTERVAL NOTE:  01/05/2023  10:24 AM  Benjamin Newton  has presented today for surgery, with the diagnosis of M20.41 - Hammertoe of right foot M20.11 - Hallux valgus, right foot M20.5X1 - Overlapping toe, right foot.  The various methods of treatment have been discussed with the patient.  No guarantees were given.  After consideration of risks, benefits and other options for treatment, the patient has consented to surgery.  I have reviewed the patients' chart and labs.    PROCEDURE: RIGHT 2ND TOE AMPUTATION  A history and physical examination was performed in my office.  The patient was reexamined.  There have been no changes to this history and physical examination.  Rosetta Posner, DPM

## 2023-01-05 NOTE — Anesthesia Postprocedure Evaluation (Signed)
Anesthesia Post Note  Patient: Benjamin Newton  Procedure(s) Performed: AMPUTATION TOE METATARSOPHALANGEAL SECOND (Right: Toe)  Patient location during evaluation: PACU Anesthesia Type: General Level of consciousness: awake and alert Pain management: pain level controlled Vital Signs Assessment: post-procedure vital signs reviewed and stable Respiratory status: spontaneous breathing, nonlabored ventilation, respiratory function stable and patient connected to nasal cannula oxygen Cardiovascular status: blood pressure returned to baseline and stable Postop Assessment: no apparent nausea or vomiting Anesthetic complications: no   No notable events documented.   Last Vitals:  Vitals:   01/05/23 1130 01/05/23 1135  BP: 128/73 134/76  Pulse: 73 74  Resp: 17 13  Temp:  (!) 36.3 C  SpO2: 97% 98%    Last Pain:  Vitals:   01/05/23 1135  TempSrc:   PainSc: 0-No pain                 Beyonka Pitney C Lowen Barringer

## 2023-01-05 NOTE — Transfer of Care (Signed)
Immediate Anesthesia Transfer of Care Note  Patient: Benjamin Newton  Procedure(s) Performed: AMPUTATION TOE METATARSOPHALANGEAL SECOND (Right: Toe)  Patient Location: PACU  Anesthesia Type:General  Level of Consciousness: awake  Airway & Oxygen Therapy: Patient Spontanous Breathing and Patient connected to face mask oxygen  Post-op Assessment: Report given to RN and Post -op Vital signs reviewed and stable  Post vital signs: Reviewed and stable  Last Vitals: See PACU flow sheet for temp Vitals Value Taken Time  BP 123/67 01/05/23 1113  Temp    Pulse 73 01/05/23 1115  Resp 10 01/05/23 1115  SpO2 100 % 01/05/23 1115  Vitals shown include unfiled device data.  Last Pain:  Vitals:   01/05/23 0847  TempSrc: Temporal  PainSc: 0-No pain         Complications: No notable events documented.

## 2023-01-06 ENCOUNTER — Encounter: Payer: Self-pay | Admitting: Podiatry

## 2023-01-06 MED ORDER — CEFAZOLIN SODIUM-DEXTROSE 2-3 GM-%(50ML) IV SOLR
INTRAVENOUS | Status: DC | PRN
  Administered 2023-01-05: 2 g via INTRAVENOUS

## 2023-01-06 NOTE — Anesthesia Procedure Notes (Signed)
Procedure Name: LMA Insertion Date/Time: 01/06/2023 10:39 AM  Performed by: Genia Del, CRNAPre-anesthesia Checklist: Patient identified, Patient being monitored, Timeout performed, Emergency Drugs available and Suction available Patient Re-evaluated:Patient Re-evaluated prior to induction Oxygen Delivery Method: Circle system utilized Preoxygenation: Pre-oxygenation with 100% oxygen Induction Type: IV induction Ventilation: Mask ventilation without difficulty LMA: LMA inserted LMA Size: 4.0 Tube type: Oral Number of attempts: 1 Placement Confirmation: positive ETCO2 and breath sounds checked- equal and bilateral Tube secured with: Tape Dental Injury: Teeth and Oropharynx as per pre-operative assessment

## 2023-01-06 NOTE — Addendum Note (Signed)
Addendum  created 01/06/23 [id number] by Genia Del, CRNA   Child order released for a procedure order, Clinical Note Signed, Intraprocedure Blocks edited, Intraprocedure Meds edited, LDA created via procedure documentation, SmartForm saved

## 2023-06-13 DIAGNOSIS — J069 Acute upper respiratory infection, unspecified: Secondary | ICD-10-CM | POA: Diagnosis not present

## 2023-07-24 DIAGNOSIS — H524 Presbyopia: Secondary | ICD-10-CM | POA: Diagnosis not present

## 2023-09-21 DIAGNOSIS — M542 Cervicalgia: Secondary | ICD-10-CM | POA: Diagnosis not present

## 2023-09-29 ENCOUNTER — Other Ambulatory Visit: Payer: Self-pay

## 2023-09-29 DIAGNOSIS — R972 Elevated prostate specific antigen [PSA]: Secondary | ICD-10-CM

## 2023-09-30 LAB — PSA: Prostate Specific Ag, Serum: 5.3 ng/mL — ABNORMAL HIGH (ref 0.0–4.0)

## 2023-10-05 ENCOUNTER — Ambulatory Visit: Payer: Self-pay | Admitting: Urology

## 2023-10-11 ENCOUNTER — Ambulatory Visit: Admitting: Urology

## 2023-10-11 VITALS — BP 145/75 | HR 60 | Wt 176.0 lb

## 2023-10-11 DIAGNOSIS — R972 Elevated prostate specific antigen [PSA]: Secondary | ICD-10-CM

## 2023-10-11 NOTE — Progress Notes (Signed)
   10/11/2023 1:30 PM   Dajaun Gorey [birth year] [id number]  Reason for visit: Follow up elevated PSA  HPI: Healthy 73 year old male who I saw originally in January 2024 for an elevated PSA of 4.96.  This had increased from 3.7 in September 2023, and 4.2 in June 2023, 3.1 in June 2022.  No urinary symptoms, DRE was benign.  Repeat PSA decreased to 4.2(13% free), but increased again to 5.2 in July 2024, most recently 5.16 Sep 2023.  PSA overall relatively stable over the last few years.  Ranging from 4-5.  We discussed options including repeat PSA in 1 year, PHI score, prostate MRI, or prostate biopsy.  Risks and benefits discussed extensively, as well as the AUA guidelines that do not recommend routine screening in men over age 38.  Using shared decision making he opted for PHI score in 6months, consider MRI if suspicious.  RTC 6 months PHI score prior  Lawerence Pressman, MD  [facility] [address], [address] [place], [place] [address] [phone]

## 2023-11-22 DIAGNOSIS — E785 Hyperlipidemia, unspecified: Secondary | ICD-10-CM | POA: Diagnosis not present

## 2023-11-22 DIAGNOSIS — I10 Essential (primary) hypertension: Secondary | ICD-10-CM | POA: Diagnosis not present

## 2023-11-29 DIAGNOSIS — M1990 Unspecified osteoarthritis, unspecified site: Secondary | ICD-10-CM | POA: Diagnosis not present

## 2023-11-29 DIAGNOSIS — Z1331 Encounter for screening for depression: Secondary | ICD-10-CM | POA: Diagnosis not present

## 2023-11-29 DIAGNOSIS — Z0000 Encounter for general adult medical examination without abnormal findings: Secondary | ICD-10-CM | POA: Diagnosis not present

## 2023-11-29 DIAGNOSIS — E785 Hyperlipidemia, unspecified: Secondary | ICD-10-CM | POA: Diagnosis not present

## 2023-12-01 ENCOUNTER — Other Ambulatory Visit: Payer: Self-pay | Admitting: Family Medicine

## 2023-12-01 DIAGNOSIS — I10 Essential (primary) hypertension: Secondary | ICD-10-CM

## 2023-12-01 DIAGNOSIS — E785 Hyperlipidemia, unspecified: Secondary | ICD-10-CM

## 2023-12-01 DIAGNOSIS — I119 Hypertensive heart disease without heart failure: Secondary | ICD-10-CM

## 2023-12-12 ENCOUNTER — Ambulatory Visit
Admission: RE | Admit: 2023-12-12 | Discharge: 2023-12-12 | Disposition: A | Discharge status: Home / Self Care | Source: Ambulatory Visit | Attending: Family Medicine | Admitting: Family Medicine

## 2023-12-12 ENCOUNTER — Encounter: Payer: Self-pay | Admitting: Urology

## 2023-12-12 DIAGNOSIS — I119 Hypertensive heart disease without heart failure: Secondary | ICD-10-CM | POA: Insufficient documentation

## 2023-12-12 DIAGNOSIS — E785 Hyperlipidemia, unspecified: Secondary | ICD-10-CM | POA: Insufficient documentation

## 2023-12-12 DIAGNOSIS — I10 Essential (primary) hypertension: Secondary | ICD-10-CM | POA: Insufficient documentation

## 2023-12-28 DIAGNOSIS — S41112A Laceration without foreign body of left upper arm, initial encounter: Secondary | ICD-10-CM | POA: Insufficient documentation

## 2024-01-01 DIAGNOSIS — I38 Endocarditis, valve unspecified: Secondary | ICD-10-CM | POA: Diagnosis not present

## 2024-03-07 ENCOUNTER — Encounter: Payer: Self-pay | Admitting: Urology

## 2024-04-01 ENCOUNTER — Other Ambulatory Visit: Payer: Self-pay

## 2024-04-02 ENCOUNTER — Other Ambulatory Visit

## 2024-04-02 ENCOUNTER — Other Ambulatory Visit: Payer: Self-pay

## 2024-04-02 DIAGNOSIS — R972 Elevated prostate specific antigen [PSA]: Secondary | ICD-10-CM | POA: Diagnosis not present

## 2024-04-04 LAB — REFLEX INFORMATION

## 2024-04-04 LAB — PROSTATE HEALTH INDEX: Prostate Specific Ag: 3.9 ng/mL (ref 0.0–3.9)

## 2024-04-09 ENCOUNTER — Ambulatory Visit: Admitting: Urology

## 2024-04-09 VITALS — BP 135/77 | HR 68 | Ht 68.0 in | Wt 174.0 lb

## 2024-04-09 DIAGNOSIS — I10 Essential (primary) hypertension: Secondary | ICD-10-CM | POA: Insufficient documentation

## 2024-04-09 DIAGNOSIS — R972 Elevated prostate specific antigen [PSA]: Secondary | ICD-10-CM | POA: Diagnosis not present

## 2024-04-09 NOTE — Patient Instructions (Signed)

## 2024-04-09 NOTE — Progress Notes (Signed)
   04/09/2024 2:10 PM   [facility] [birth year] [id number]  Reason for visit: Follow up elevated PSA  History: Healthy 73 year old male I have followed for mildly elevated PSA PSA has ranged from 4-5 since 2023, he has deferred biopsy or MRI  Physical Exam: BP 135/77 (BP Location: Left Arm, Patient Position: Sitting, Cuff Size: Normal)   Pulse 68   Ht 5' 8 (1.727 m)   Wt 174 lb (78.9 kg)   SpO2 97%   BMI 26.46 kg/m   Imaging/labs: Most recent PSA 3.9  Today: Denies any urinary complaints  Plan:   PSA screening: PSA has been mildly elevated ranging from 4-5 since 2023, we opted for a PHI score and fortunately PSA returned normal at 3.9.  We reviewed the AUA guidelines regarding risks and benefits of screening, with his excellent health he would like to continue screening on a yearly basis through age 25. RTC 1 year PHI score prior   Benjamin JAYSON Burnet, MD  [facility] [address] [place], [place] [address] [phone]

## 2024-04-10 ENCOUNTER — Ambulatory Visit: Admitting: Urology

## 2025-04-03 ENCOUNTER — Other Ambulatory Visit

## 2025-04-09 ENCOUNTER — Ambulatory Visit: Admitting: Urology
